# Patient Record
Sex: Female | Born: 1970 | Race: White | Hispanic: No | Marital: Single | Smoking: Former smoker
Health system: Southern US, Community
[De-identification: ages and names within clinical notes are randomized; demographics above are authoritative.]

## PROBLEM LIST (undated history)

## (undated) DIAGNOSIS — R4586 Emotional lability: Secondary | ICD-10-CM

## (undated) DIAGNOSIS — C801 Malignant (primary) neoplasm, unspecified: Secondary | ICD-10-CM

## (undated) HISTORY — DX: Emotional lability: R45.86

---

## 2002-11-19 DIAGNOSIS — C801 Malignant (primary) neoplasm, unspecified: Secondary | ICD-10-CM

## 2002-11-19 HISTORY — PX: CERVICAL CONE BIOPSY: SUR198

## 2002-11-19 HISTORY — DX: Malignant (primary) neoplasm, unspecified: C80.1

## 2006-12-02 ENCOUNTER — Ambulatory Visit (HOSPITAL_COMMUNITY): Admission: RE | Admit: 2006-12-02 | Discharge: 2006-12-02 | Payer: Self-pay | Admitting: Family Medicine

## 2009-06-09 ENCOUNTER — Ambulatory Visit (HOSPITAL_COMMUNITY): Admission: RE | Admit: 2009-06-09 | Discharge: 2009-06-09 | Payer: Self-pay | Admitting: Pediatrics

## 2009-07-27 ENCOUNTER — Ambulatory Visit (HOSPITAL_COMMUNITY): Admission: RE | Admit: 2009-07-27 | Discharge: 2009-07-27 | Payer: Self-pay | Admitting: Pediatrics

## 2009-08-01 ENCOUNTER — Ambulatory Visit (HOSPITAL_COMMUNITY): Admission: RE | Admit: 2009-08-01 | Discharge: 2009-08-01 | Payer: Self-pay | Admitting: Pediatrics

## 2010-05-25 ENCOUNTER — Ambulatory Visit (HOSPITAL_COMMUNITY): Admission: RE | Admit: 2010-05-25 | Discharge: 2010-05-25 | Payer: Self-pay | Admitting: Family Medicine

## 2011-08-20 ENCOUNTER — Other Ambulatory Visit (HOSPITAL_COMMUNITY): Payer: Self-pay | Admitting: Pediatrics

## 2011-08-20 DIAGNOSIS — R110 Nausea: Secondary | ICD-10-CM

## 2011-08-21 ENCOUNTER — Other Ambulatory Visit (HOSPITAL_COMMUNITY): Payer: Self-pay | Admitting: Pediatrics

## 2011-08-21 ENCOUNTER — Ambulatory Visit (HOSPITAL_COMMUNITY)
Admission: RE | Admit: 2011-08-21 | Discharge: 2011-08-21 | Disposition: A | Discharge status: Home / Self Care | Payer: Managed Care, Other (non HMO) | Source: Ambulatory Visit | Attending: Pediatrics | Admitting: Pediatrics

## 2011-08-21 DIAGNOSIS — R109 Unspecified abdominal pain: Secondary | ICD-10-CM | POA: Insufficient documentation

## 2011-08-21 DIAGNOSIS — R110 Nausea: Secondary | ICD-10-CM | POA: Insufficient documentation

## 2011-08-23 ENCOUNTER — Other Ambulatory Visit (HOSPITAL_COMMUNITY): Payer: Self-pay | Admitting: Internal Medicine

## 2011-08-23 DIAGNOSIS — R110 Nausea: Secondary | ICD-10-CM

## 2011-08-23 DIAGNOSIS — R1011 Right upper quadrant pain: Secondary | ICD-10-CM

## 2011-08-27 ENCOUNTER — Other Ambulatory Visit (HOSPITAL_COMMUNITY): Payer: Self-pay | Admitting: Pediatrics

## 2011-08-27 ENCOUNTER — Encounter (HOSPITAL_COMMUNITY)
Admission: RE | Admit: 2011-08-27 | Discharge: 2011-08-27 | Disposition: A | Discharge status: Home / Self Care | Payer: Managed Care, Other (non HMO) | Source: Ambulatory Visit | Attending: Internal Medicine | Admitting: Internal Medicine

## 2011-08-27 ENCOUNTER — Encounter (HOSPITAL_COMMUNITY): Payer: Self-pay

## 2011-08-27 DIAGNOSIS — R1011 Right upper quadrant pain: Secondary | ICD-10-CM

## 2011-08-27 DIAGNOSIS — R110 Nausea: Secondary | ICD-10-CM | POA: Insufficient documentation

## 2011-08-27 HISTORY — DX: Malignant (primary) neoplasm, unspecified: C80.1

## 2011-08-27 MED ORDER — SINCALIDE 5 MCG IJ SOLR
INTRAMUSCULAR | Status: AC
  Administered 2011-08-27: 1.32 ug
  Filled 2011-08-27: qty 5

## 2011-08-27 MED ORDER — TECHNETIUM TC 99M MEBROFENIN IV KIT
5.0000 | PACK | Freq: Once | INTRAVENOUS | Status: AC | PRN
  Administered 2011-08-27: 4.7 via INTRAVENOUS

## 2011-08-27 MED ORDER — SINCALIDE 5 MCG IJ SOLR: 0.0200 ug/kg | Freq: Once | INTRAMUSCULAR | Status: DC

## 2011-09-04 ENCOUNTER — Ambulatory Visit: Payer: Managed Care, Other (non HMO) | Admitting: Gastroenterology

## 2011-09-05 ENCOUNTER — Ambulatory Visit (INDEPENDENT_AMBULATORY_CARE_PROVIDER_SITE_OTHER): Payer: Managed Care, Other (non HMO) | Admitting: Gastroenterology

## 2011-09-05 ENCOUNTER — Encounter: Payer: Self-pay | Admitting: Gastroenterology

## 2011-09-05 DIAGNOSIS — R140 Abdominal distension (gaseous): Secondary | ICD-10-CM | POA: Insufficient documentation

## 2011-09-05 DIAGNOSIS — R143 Flatulence: Secondary | ICD-10-CM

## 2011-09-05 DIAGNOSIS — R1013 Epigastric pain: Secondary | ICD-10-CM | POA: Insufficient documentation

## 2011-09-05 DIAGNOSIS — R110 Nausea: Secondary | ICD-10-CM | POA: Insufficient documentation

## 2011-09-05 MED ORDER — DEXLANSOPRAZOLE 60 MG PO CPDR: 60.0000 mg | DELAYED_RELEASE_CAPSULE | Freq: Every day | ORAL | Status: AC

## 2011-09-05 NOTE — Patient Instructions (Addendum)
Please take Dexilant 60mg  daily for next four weeks. Take 1/2 hour before breakfast daily. Please call in 2 weeks with a progress report. If you continue to have nausea, upper abdominal discomfort, would recommend upper endoscopy for further evaluation. If you develop fever, severe abdominal pain, vomiting, excessive bloating we may need to consider a CT of the abdomen and pelvis. Please don't hesitate to call if you have any questions or concerns. Please limit Advil or other NSAIDs such as Aleve, aspirin as they may irritate your stomach. Consider Tylenol for pain relief.

## 2011-09-05 NOTE — Progress Notes (Signed)
Primary Care Physician:  Ara Kussmaul, MD  Primary Gastroenterologist:  Roetta Sessions, MD   Chief Complaint  Patient presents with  . Nausea    HPI:  Theresa Saunders is a 40 y.o. female here for further evaluation of recent illness. On 08/13/11, started having epigastric discomfort, nausea, bloating, overall felt bad. Initially seen at Urgent Care. Labs okay including H.Pylori, lipase, cmet, cbc, urine per PCP note. Went to PCP couple of days later for persistent symptoms.  Abd u/s normal. HIDA normal. No reproduction of symptoms.   Symptoms worse with food. Really hard to eat due to nausea. Decreased BMs secondary to poor oral intake. No significant weight loss.    Denies heartburn. Prior to 08/13/11, none of these symptoms. She has had alternating constipation/diarrhea for 7-8 years. Eats spicy foods to alleviate constipation when needed. Certain foods trigger diarrhea. No melena, brbpr.   Headache every day with these symptoms. Low grade fever of 99.   Over the past several days, finally able to eat better. Energy coming back . Has been out of work. Denies ill contacts or recent abx.    Current Outpatient Prescriptions  Medication Sig Dispense Refill  . citalopram (CELEXA) 40 MG tablet Take 40 mg by mouth daily.       Marland Kitchen ibuprofen (ADVIL,MOTRIN) 200 MG tablet Take 200 mg by mouth every 6 (six) hours as needed.        Marland Kitchen dexlansoprazole (DEXILANT) 60 MG capsule Take 1 capsule (60 mg total) by mouth daily.  30 capsule  0  . ondansetron (ZOFRAN) 8 MG tablet         Allergies as of 09/05/2011  . (No Known Allergies)    Past Medical History  Diagnosis Date  . Cancer 2004    cervical   . 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 Cervical cone biopsy 2004    Family History  Problem Relation Age of Onset  . Colon cancer Neg Hx   . Liver disease Neg Hx   . Heart disease Father   . Colon polyps Father     less than age 2    History    Social History  . Marital Status: Single    Spouse Name: N/A    Number of Children: 3  . Years of Education: N/A   Occupational History  .  [employer]    Curator   Social History Main Topics  . Smoking status: Former Smoker -- 0.5 packs/day    Types: Cigarettes  . Smokeless tobacco: Not on file   Comment: quit about 5 months  . Alcohol Use: No  . Drug Use: No  . Sexually Active: Yes -- Female partner(s)    Birth Control/ Protection: Surgical     husband-vasectomy   Other Topics Concern  . Not on file   Social History Narrative  . No narrative on file      ROS:  General:  See HPI. Eyes: Negative for vision changes.  ENT: Negative for hoarseness, difficulty swallowing , nasal congestion. CV: Negative for chest pain, angina, palpitations, dyspnea on exertion, peripheral edema.  Respiratory: Negative for dyspnea at rest, dyspnea on exertion, cough, sputum, wheezing.  GI: See history of present illness. GU:  Negative for dysuria, hematuria, urinary incontinence, urinary frequency, nocturnal urination.  MS: Negative for joint pain, low back pain.  Derm: Negative for rash or itching.  Neuro: Negative for weakness, abnormal sensation, seizure,  frequent headaches, memory loss, confusion. Recent H/A. Psych: Negative for anxiety, depression, suicidal ideation, hallucinations.  Endo: Negative for unusual weight change.  Heme: Negative for bruising or bleeding. Allergy: Negative for rash or hives.    Physical Examination:  BP 114/77  Pulse 82  Temp(Src) 97.6 F (36.4 C) (Temporal)  Ht 5\' 6"  (1.676 m)  Wt 150 lb 6.4 oz (68.221 kg)  BMI 24.28 kg/m2  LMP 08/23/2011   General: Well-nourished, well-developed in no acute distress.  Head: Normocephalic, atraumatic.   Eyes: Conjunctiva pink, no icterus. Mouth: Oropharyngeal mucosa moist and pink , no lesions erythema or exudate. Neck: Supple without thyromegaly, masses, or lymphadenopathy.  Lungs: Clear to auscultation  bilaterally.  Heart: Regular rate and rhythm, no murmurs rubs or gallops.  Abdomen: Bowel sounds are normal, minimal epigastric tenderness, nondistended, no hepatosplenomegaly or masses, no abdominal bruits or    hernia , no rebound or guarding.   Rectal: Not performed. Extremities: No lower extremity edema. No clubbing or deformities.  Neuro: Alert and oriented x 4 , grossly normal neurologically.  Skin: Warm and dry, no rash or jaundice.   Psych: Alert and cooperative, normal mood and affect.  Labs: As above.  Imaging Studies: Nm Hepato W/eject Fract  08/27/2011  *RADIOLOGY REPORT*  Clinical Data:  Right upper quadrant pain, severe nausea  NUCLEAR MEDICINE HEPATOBILIARY IMAGING WITH GALLBLADDER EF:  Technique: Sequential images of the abdomen were obtained for 60 minutes following intravenous administration of radiopharmaceutical.  Patient then received an infusion of 0.02 ugm/kg of CCK analog intravenously over 30 minutes, and imaging was continued for 30 minutes.  A time-activity curve was generated from tracer within the gallbladder following CCK administration, and the gallbladder ejection fraction was calculated.  Radiopharmaceutical:  4.7 mCi Tc-69m mebrofenin Pharmaceutical:  1.32 mcg CCK  Comparison: None  Findings:  Prompt tracer extraction from bloodstream, indicating normal hepatocellular function. Prompt excretion of tracer into biliary tree. Gallbladder visualized at 7 minutes. Small bowel visualized at 21 minutes. No hepatic retention of tracer.  Subjectively normal emptying of tracer from gallbladder following CCK stimulation. Calculated gallbladder ejection fraction is 88%, normal. Patient experienced nausea and abdominal pressure following CCK administration.  IMPRESSION: Patent biliary tree. Normal gallbladder ejection fraction of 88% following CCK stimulation. Nausea and abdominal pressure following CCK stimulation.  Normal values for gallbladder ejection fraction: > 30% for exams  utilizing sincalide (CCK) > 50% for exams utilizing fatty meal stimulation  Original Report Authenticated By: Lollie Marrow, M.D.   US Abdomen Limited Ruq  08/21/2011  *RADIOLOGY REPORT*  Clinical Data:  Abdominal pain, nausea  LIMITED ABDOMINAL ULTRASOUND - RIGHT UPPER QUADRANT  Comparison:  None  Findings:  Gallbladder:  Normally distended without stones or wall thickening. No pericholecystic fluid or sonographic Murphy sign.  Common bile duct:  Normal caliber 4 mm diameter.  Liver:  Normal appearance.  IMPRESSION: Normal limited right upper quadrant ultrasound examination.                   Original Report Authenticated By: Lollie Marrow, M.D.

## 2011-09-05 NOTE — Assessment & Plan Note (Signed)
2-3 week h/o epigastric discomfort associated with bloating, nausea. Patient describes debilitating symptoms. W/U unrevealing so far. Significantly improved over the last several days. At baseline, suspect element of IBS.   Current symptoms, ?viral, doubt biliary or partial obstruction. DDX also includes gastritis, PUD. As patient is feeling better, we will treat with PPI for next four weeks. Avoid NSAIDS/ASA. She will call with PR in two weeks OR if symptoms worsen she will call sooner. At that point, based on symptoms she may need CT A/P vs EGD.

## 2011-09-05 NOTE — Progress Notes (Signed)
Cc to PCP 

## 2014-03-05 ENCOUNTER — Other Ambulatory Visit: Payer: Self-pay | Admitting: *Deleted

## 2014-03-05 DIAGNOSIS — N63 Unspecified lump in unspecified breast: Secondary | ICD-10-CM

## 2014-03-05 DIAGNOSIS — N644 Mastodynia: Secondary | ICD-10-CM

## 2014-03-10 ENCOUNTER — Encounter (INDEPENDENT_AMBULATORY_CARE_PROVIDER_SITE_OTHER): Payer: Self-pay

## 2014-03-10 ENCOUNTER — Ambulatory Visit
Admission: RE | Admit: 2014-03-10 | Discharge: 2014-03-10 | Disposition: A | Discharge status: Home / Self Care | Payer: BC Managed Care – PPO | Source: Ambulatory Visit | Attending: *Deleted | Admitting: *Deleted

## 2014-03-10 DIAGNOSIS — N644 Mastodynia: Secondary | ICD-10-CM

## 2014-03-10 DIAGNOSIS — N63 Unspecified lump in unspecified breast: Secondary | ICD-10-CM

## 2016-11-05 ENCOUNTER — Other Ambulatory Visit: Payer: Self-pay | Admitting: *Deleted

## 2016-11-05 DIAGNOSIS — N63 Unspecified lump in unspecified breast: Secondary | ICD-10-CM

## 2016-11-09 ENCOUNTER — Ambulatory Visit
Admission: RE | Admit: 2016-11-09 | Discharge: 2016-11-09 | Disposition: A | Discharge status: Home / Self Care | Payer: BLUE CROSS/BLUE SHIELD | Source: Ambulatory Visit | Attending: *Deleted | Admitting: *Deleted

## 2016-11-09 DIAGNOSIS — N63 Unspecified lump in unspecified breast: Secondary | ICD-10-CM

## 2018-07-19 ENCOUNTER — Ambulatory Visit (HOSPITAL_COMMUNITY)
Admission: RE | Admit: 2018-07-19 | Discharge: 2018-07-19 | Disposition: A | Discharge status: Home / Self Care | Payer: BLUE CROSS/BLUE SHIELD | Source: Ambulatory Visit | Attending: Physician Assistant | Admitting: Physician Assistant

## 2018-07-19 ENCOUNTER — Other Ambulatory Visit (HOSPITAL_COMMUNITY): Payer: Self-pay | Admitting: Physician Assistant

## 2018-07-19 DIAGNOSIS — R52 Pain, unspecified: Secondary | ICD-10-CM

## 2018-07-19 DIAGNOSIS — I700 Atherosclerosis of aorta: Secondary | ICD-10-CM | POA: Diagnosis not present

## 2018-07-19 DIAGNOSIS — K760 Fatty (change of) liver, not elsewhere classified: Secondary | ICD-10-CM | POA: Diagnosis not present

## 2018-07-19 MED ORDER — IOPAMIDOL (ISOVUE-300) INJECTION 61%
100.0000 mL | Freq: Once | INTRAVENOUS | Status: AC | PRN
  Administered 2018-07-19: 100 mL via INTRAVENOUS

## 2018-07-29 ENCOUNTER — Encounter: Payer: Self-pay | Admitting: Gastroenterology

## 2018-09-05 ENCOUNTER — Ambulatory Visit: Payer: BLUE CROSS/BLUE SHIELD | Admitting: Gastroenterology

## 2019-08-17 ENCOUNTER — Encounter: Payer: Self-pay | Admitting: Physician Assistant

## 2020-03-14 ENCOUNTER — Other Ambulatory Visit: Payer: Self-pay | Admitting: Gastroenterology

## 2020-03-14 DIAGNOSIS — R1011 Right upper quadrant pain: Secondary | ICD-10-CM

## 2020-03-14 DIAGNOSIS — R1013 Epigastric pain: Secondary | ICD-10-CM

## 2020-03-18 ENCOUNTER — Ambulatory Visit
Admission: RE | Admit: 2020-03-18 | Discharge: 2020-03-18 | Disposition: A | Discharge status: Home / Self Care | Payer: BLUE CROSS/BLUE SHIELD | Source: Ambulatory Visit | Attending: Gastroenterology | Admitting: Gastroenterology

## 2020-03-18 DIAGNOSIS — R1013 Epigastric pain: Secondary | ICD-10-CM

## 2020-03-18 DIAGNOSIS — R1011 Right upper quadrant pain: Secondary | ICD-10-CM

## 2020-05-06 ENCOUNTER — Other Ambulatory Visit: Payer: Self-pay | Admitting: Gastroenterology

## 2020-05-06 ENCOUNTER — Other Ambulatory Visit (HOSPITAL_COMMUNITY): Payer: Self-pay | Admitting: Gastroenterology

## 2020-05-06 DIAGNOSIS — R1011 Right upper quadrant pain: Secondary | ICD-10-CM

## 2020-05-06 DIAGNOSIS — R1013 Epigastric pain: Secondary | ICD-10-CM

## 2020-05-17 ENCOUNTER — Encounter (HOSPITAL_COMMUNITY)
Admission: RE | Admit: 2020-05-17 | Discharge: 2020-05-17 | Disposition: A | Discharge status: Home / Self Care | Payer: BC Managed Care – PPO | Source: Ambulatory Visit | Attending: Gastroenterology | Admitting: Gastroenterology

## 2020-05-17 DIAGNOSIS — R1013 Epigastric pain: Secondary | ICD-10-CM | POA: Diagnosis present

## 2020-05-17 DIAGNOSIS — R1011 Right upper quadrant pain: Secondary | ICD-10-CM | POA: Diagnosis present

## 2020-05-17 MED ORDER — TECHNETIUM TC 99M MEBROFENIN IV KIT
5.0000 | PACK | Freq: Once | INTRAVENOUS | Status: AC | PRN
  Administered 2020-05-17: 5 via INTRAVENOUS

## 2020-06-20 ENCOUNTER — Other Ambulatory Visit: Payer: Self-pay | Admitting: Gastroenterology

## 2020-06-20 DIAGNOSIS — R109 Unspecified abdominal pain: Secondary | ICD-10-CM

## 2020-07-01 ENCOUNTER — Other Ambulatory Visit: Payer: BC Managed Care – PPO

## 2020-07-04 ENCOUNTER — Other Ambulatory Visit: Payer: Self-pay | Admitting: *Deleted

## 2020-07-04 DIAGNOSIS — Z1231 Encounter for screening mammogram for malignant neoplasm of breast: Secondary | ICD-10-CM

## 2020-07-07 ENCOUNTER — Other Ambulatory Visit: Payer: Self-pay | Admitting: Physician Assistant

## 2020-07-07 DIAGNOSIS — N6321 Unspecified lump in the left breast, upper outer quadrant: Secondary | ICD-10-CM

## 2020-07-22 ENCOUNTER — Ambulatory Visit
Admission: RE | Admit: 2020-07-22 | Discharge: 2020-07-22 | Disposition: A | Discharge status: Home / Self Care | Payer: BC Managed Care – PPO | Source: Ambulatory Visit | Attending: Physician Assistant | Admitting: Physician Assistant

## 2020-07-22 ENCOUNTER — Other Ambulatory Visit: Payer: Self-pay

## 2020-07-22 DIAGNOSIS — N6321 Unspecified lump in the left breast, upper outer quadrant: Secondary | ICD-10-CM

## 2021-06-15 ENCOUNTER — Other Ambulatory Visit: Payer: Self-pay | Admitting: Gastroenterology

## 2021-06-15 DIAGNOSIS — R109 Unspecified abdominal pain: Secondary | ICD-10-CM

## 2021-07-05 IMAGING — US US BREAST*L* LIMITED INC AXILLA
1 series · 13 of 19 positions shown · non-contrast
Comparison: Previous exam(s).

CLINICAL DATA: 49-year-old female presenting for evaluation of a
palpable lump in the left breast for about 2-3 weeks.

EXAM:
DIGITAL DIAGNOSTIC BILATERAL MAMMOGRAM WITH TOMO AND CAD; ULTRASOUND
LEFT BREAST LIMITED

[Series 1: us breast*left* limited inc axilla · 0.06mm/px · 13 of 19 slices shown]
[im 1/19]
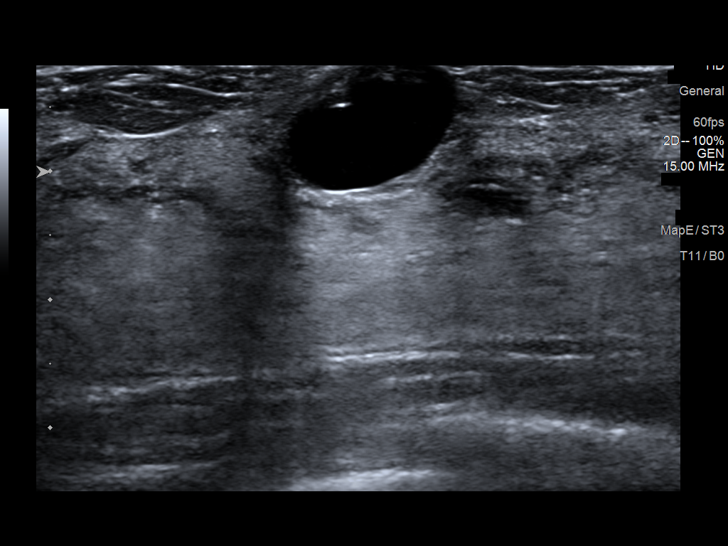
[im 3/19]
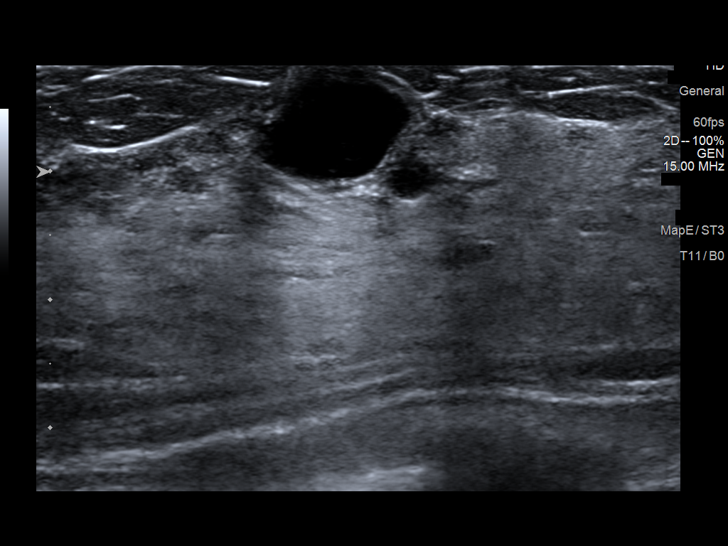
[im 4/19]
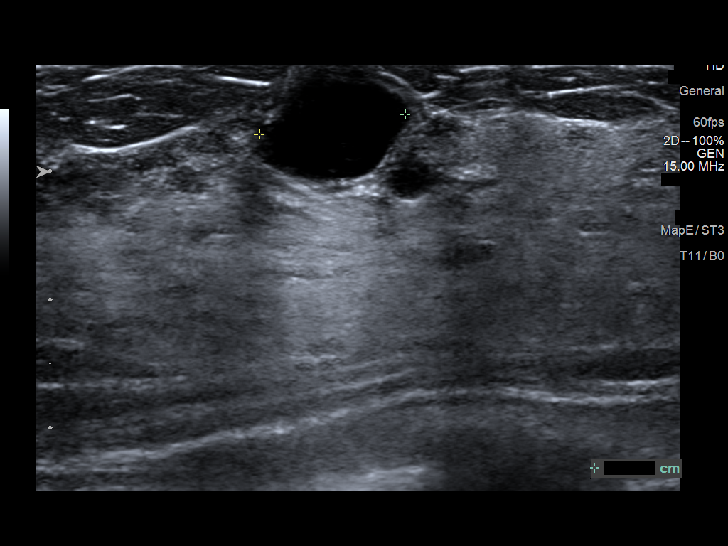
[im 6/19]
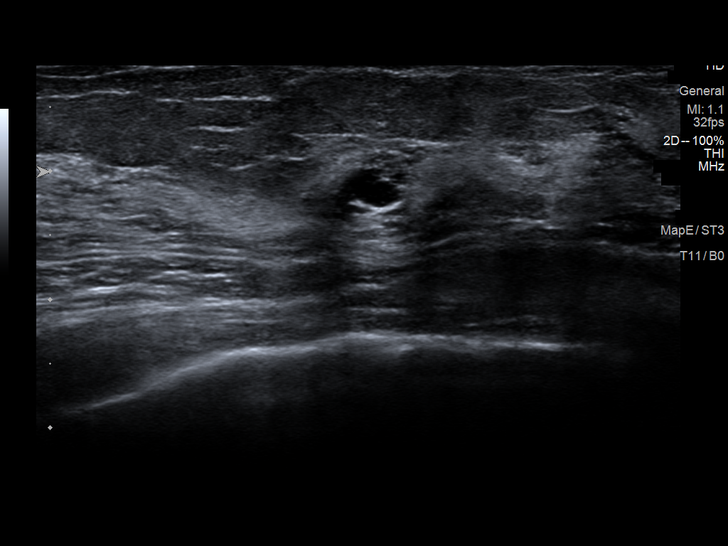
[im 7/19]
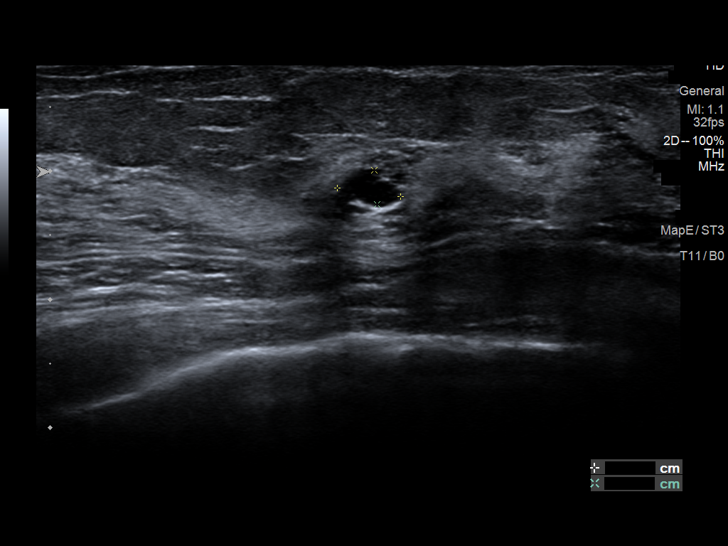
[im 9/19]
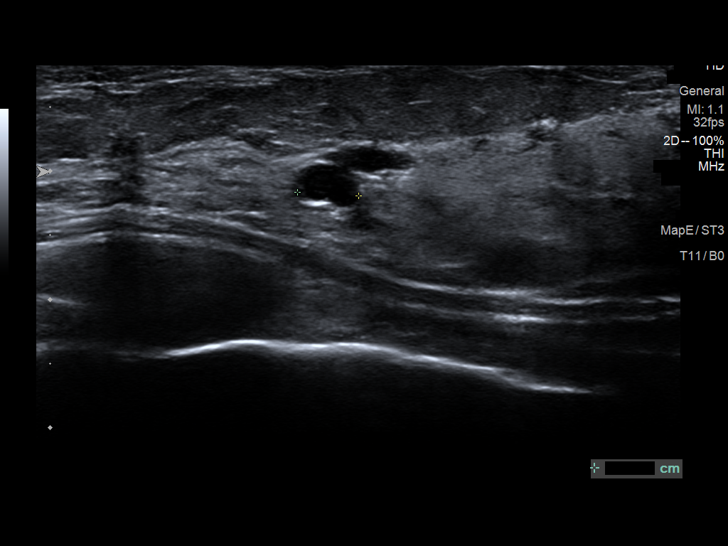
[im 10/19]
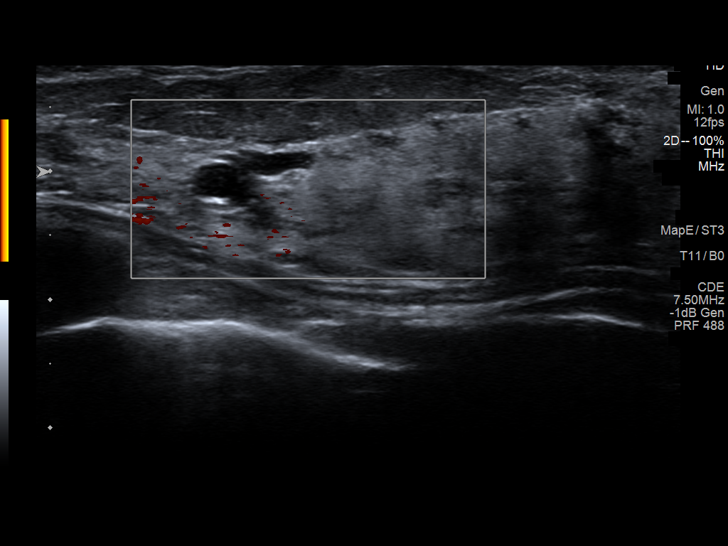
[im 11/19]
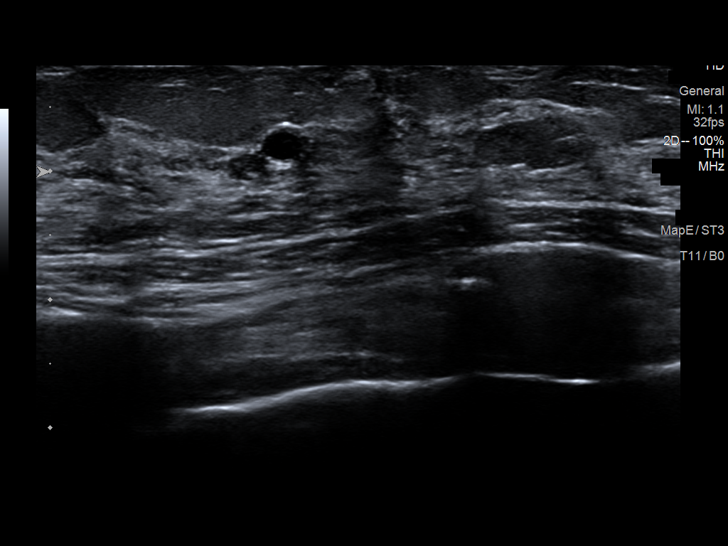
[im 13/19]
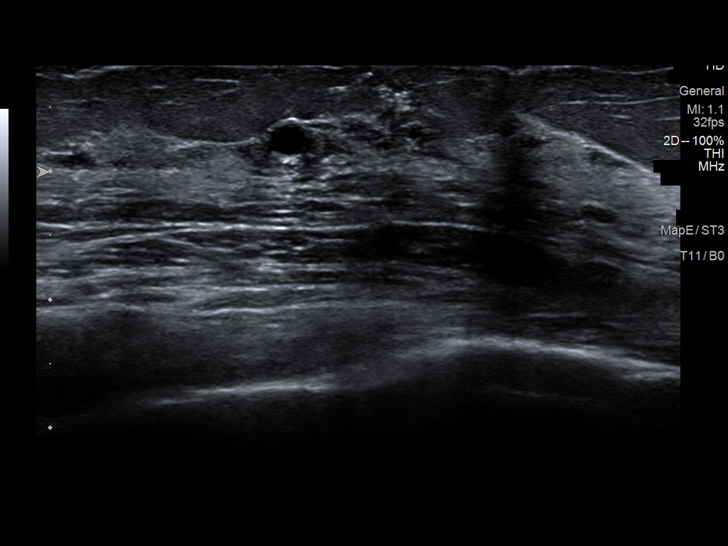
[im 14/19]
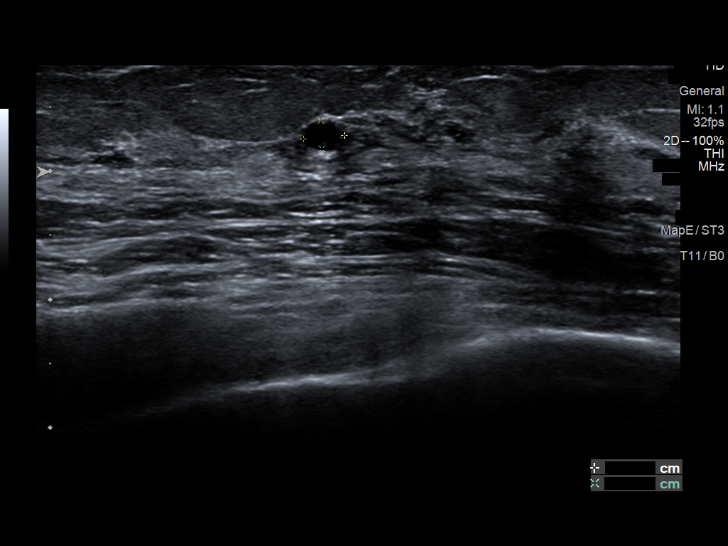
[im 16/19]
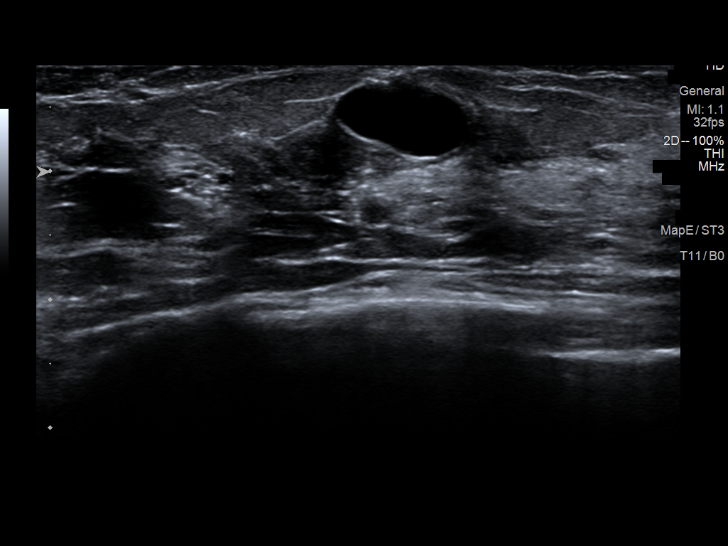
[im 17/19]
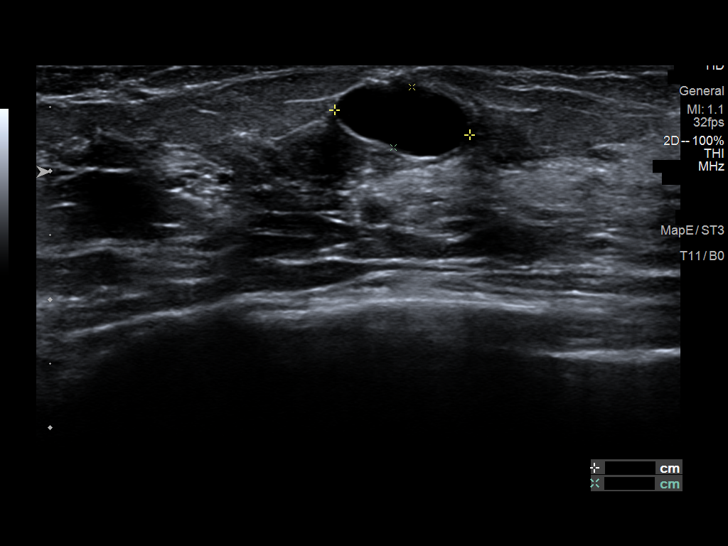
[im 19/19]
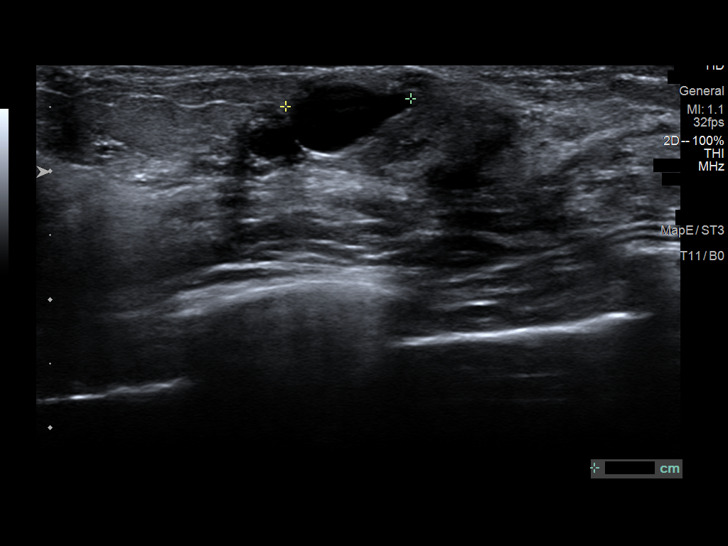

[13 of 19 positions shown; findings below may reference images not displayed]

ACR Breast Density Category d: The breast tissue is extremely dense,
which lowers the sensitivity of mammography.
FINDINGS: A BB has been placed at the palpable site of concern on the superior
left breast. There is a possible obscured superficial just deep to
the marker. An asymmetry is seen in the inferior left breast which
appears to resolve on spot compression tomosynthesis imaging.
However, due to the density of the patient's breast tissue,
ultrasound will be performed for further evaluation.

Mammographic images were processed with CAD.

Ultrasound targeted to the palpable site in the left breast at
[DATE], 2 cm from the nipple is demonstrates an anechoic oval
circumscribed superficial mass measuring 1.4 x 0.8 x 1.2 cm.
Ultrasound of the left breast at 5 o'clock, 4 cm from the nipple
demonstrates a similar-appearing mass measuring 1.1 x 0.5 x 1.0 cm.
This likely corresponds with the possible obscured mass identified
mammographically. Multiple other anechoic benign cysts are seen
scattered throughout the inferior left breast.
IMPRESSION: 1. The palpable lump in the superior left breast corresponds with a
benign cyst. Multiple other scattered benign cysts are seen in the
left breast as well.

2.  No evidence of malignancy in the bilateral breasts.

RECOMMENDATION:
Screening mammogram in one year.(Code:[id number]-IY3)

I have discussed the findings and recommendations with the patient.
If applicable, a reminder letter will be sent to the patient
regarding the next appointment.

BI-RADS CATEGORY  2: Benign.

## 2021-08-01 DIAGNOSIS — L810 Postinflammatory hyperpigmentation: Secondary | ICD-10-CM | POA: Diagnosis not present

## 2021-08-01 DIAGNOSIS — D225 Melanocytic nevi of trunk: Secondary | ICD-10-CM | POA: Diagnosis not present

## 2022-05-14 DIAGNOSIS — R030 Elevated blood-pressure reading, without diagnosis of hypertension: Secondary | ICD-10-CM | POA: Diagnosis not present

## 2022-05-14 DIAGNOSIS — Z8742 Personal history of other diseases of the female genital tract: Secondary | ICD-10-CM | POA: Diagnosis not present

## 2022-05-14 DIAGNOSIS — Z01419 Encounter for gynecological examination (general) (routine) without abnormal findings: Secondary | ICD-10-CM | POA: Diagnosis not present

## 2022-05-14 DIAGNOSIS — Z3041 Encounter for surveillance of contraceptive pills: Secondary | ICD-10-CM | POA: Diagnosis not present

## 2022-05-28 DIAGNOSIS — Z13220 Encounter for screening for lipoid disorders: Secondary | ICD-10-CM | POA: Diagnosis not present

## 2022-05-28 DIAGNOSIS — Z1321 Encounter for screening for nutritional disorder: Secondary | ICD-10-CM | POA: Diagnosis not present

## 2022-05-28 DIAGNOSIS — Z131 Encounter for screening for diabetes mellitus: Secondary | ICD-10-CM | POA: Diagnosis not present

## 2022-05-28 DIAGNOSIS — Z130 Encounter for screening for diseases of the blood and blood-forming organs and certain disorders involving the immune mechanism: Secondary | ICD-10-CM | POA: Diagnosis not present

## 2022-05-28 DIAGNOSIS — R030 Elevated blood-pressure reading, without diagnosis of hypertension: Secondary | ICD-10-CM | POA: Diagnosis not present

## 2022-05-28 DIAGNOSIS — R7989 Other specified abnormal findings of blood chemistry: Secondary | ICD-10-CM | POA: Diagnosis not present

## 2022-08-15 DIAGNOSIS — R7989 Other specified abnormal findings of blood chemistry: Secondary | ICD-10-CM | POA: Diagnosis not present

## 2022-08-15 DIAGNOSIS — E221 Hyperprolactinemia: Secondary | ICD-10-CM | POA: Diagnosis not present

## 2022-08-15 DIAGNOSIS — N926 Irregular menstruation, unspecified: Secondary | ICD-10-CM | POA: Diagnosis not present

## 2022-08-15 DIAGNOSIS — Z6824 Body mass index (BMI) 24.0-24.9, adult: Secondary | ICD-10-CM | POA: Diagnosis not present

## 2022-08-15 DIAGNOSIS — R519 Headache, unspecified: Secondary | ICD-10-CM | POA: Diagnosis not present

## 2022-08-29 DIAGNOSIS — G44209 Tension-type headache, unspecified, not intractable: Secondary | ICD-10-CM | POA: Diagnosis not present

## 2022-08-29 DIAGNOSIS — E221 Hyperprolactinemia: Secondary | ICD-10-CM | POA: Diagnosis not present

## 2022-08-29 DIAGNOSIS — E236 Other disorders of pituitary gland: Secondary | ICD-10-CM | POA: Diagnosis not present

## 2022-08-29 DIAGNOSIS — R519 Headache, unspecified: Secondary | ICD-10-CM | POA: Diagnosis not present

## 2022-08-29 DIAGNOSIS — R9089 Other abnormal findings on diagnostic imaging of central nervous system: Secondary | ICD-10-CM | POA: Diagnosis not present

## 2022-09-04 DIAGNOSIS — D352 Benign neoplasm of pituitary gland: Secondary | ICD-10-CM | POA: Diagnosis not present

## 2022-09-04 DIAGNOSIS — E221 Hyperprolactinemia: Secondary | ICD-10-CM | POA: Diagnosis not present

## 2022-09-11 DIAGNOSIS — D352 Benign neoplasm of pituitary gland: Secondary | ICD-10-CM | POA: Diagnosis not present

## 2022-09-11 DIAGNOSIS — E221 Hyperprolactinemia: Secondary | ICD-10-CM | POA: Diagnosis not present

## 2023-02-14 DIAGNOSIS — E221 Hyperprolactinemia: Secondary | ICD-10-CM | POA: Diagnosis not present

## 2023-02-14 DIAGNOSIS — D352 Benign neoplasm of pituitary gland: Secondary | ICD-10-CM | POA: Diagnosis not present

## 2023-05-14 ENCOUNTER — Other Ambulatory Visit: Payer: Self-pay

## 2023-05-14 DIAGNOSIS — R7989 Other specified abnormal findings of blood chemistry: Secondary | ICD-10-CM

## 2023-05-24 ENCOUNTER — Other Ambulatory Visit: Payer: Self-pay

## 2023-05-24 DIAGNOSIS — R7989 Other specified abnormal findings of blood chemistry: Secondary | ICD-10-CM

## 2023-06-05 ENCOUNTER — Other Ambulatory Visit (INDEPENDENT_AMBULATORY_CARE_PROVIDER_SITE_OTHER): Payer: BC Managed Care – PPO

## 2023-06-05 DIAGNOSIS — E229 Hyperfunction of pituitary gland, unspecified: Secondary | ICD-10-CM | POA: Diagnosis not present

## 2023-06-05 DIAGNOSIS — R7989 Other specified abnormal findings of blood chemistry: Secondary | ICD-10-CM

## 2023-06-05 DIAGNOSIS — D352 Benign neoplasm of pituitary gland: Secondary | ICD-10-CM | POA: Diagnosis not present

## 2023-06-05 LAB — T4, FREE: Free T4: 0.82 ng/dL (ref 0.60–1.60)

## 2023-06-05 LAB — TSH: TSH: 4.41 u[IU]/mL (ref 0.35–5.50)

## 2023-06-05 LAB — BASIC METABOLIC PANEL
BUN: 18 mg/dL (ref 6–23)
CO2: 26 mEq/L (ref 19–32)
Calcium: 9.7 mg/dL (ref 8.4–10.5)
Chloride: 105 mEq/L (ref 96–112)
Creatinine, Ser: 0.84 mg/dL (ref 0.40–1.20)
GFR: 80.44 mL/min (ref >60.00)
Glucose, Bld: 103 mg/dL — ABNORMAL HIGH (ref 70–99)
Potassium: 4.9 mEq/L (ref 3.5–5.1)
Sodium: 139 mEq/L (ref 135–145)

## 2023-06-05 LAB — CORTISOL: Cortisol, Plasma: 10 ug/dL

## 2023-06-05 LAB — FOLLICLE STIMULATING HORMONE: FSH: 60.2 m[IU]/mL

## 2023-06-05 LAB — LUTEINIZING HORMONE: LH: 26.94 m[IU]/mL

## 2023-06-10 ENCOUNTER — Encounter: Payer: Self-pay | Admitting: "Endocrinology

## 2023-06-10 ENCOUNTER — Ambulatory Visit: Payer: BC Managed Care – PPO | Admitting: "Endocrinology

## 2023-06-10 VITALS — BP 120/85 | HR 69 | Ht 66.0 in | Wt 166.8 lb

## 2023-06-10 DIAGNOSIS — E229 Hyperfunction of pituitary gland, unspecified: Secondary | ICD-10-CM

## 2023-06-10 DIAGNOSIS — D352 Benign neoplasm of pituitary gland: Secondary | ICD-10-CM

## 2023-06-10 LAB — ESTRADIOL: Estradiol: 19 pg/mL

## 2023-06-10 NOTE — Progress Notes (Signed)
Outpatient Endocrinology Note Altamese Elberfeld, MD    Theresa Saunders 01/11/1971 [id number]  Referring Provider: Orbie Hurst, NP Primary Care Provider: Ara Kussmaul, MD (Inactive) Reason for consultation: Subjective   Assessment & Plan  Theresa Saunders was seen today for elevated prolactin levels.  Diagnoses and all orders for this visit:  Pituitary microadenoma with hyperprolactinemia (HCC)    Patient was diagnosed up to 3 mm pituitary adenoma versus Rathke's cyst 3 on 08/2022 MRI brain done after her prolactin was found mildly elevated at 30.8 in 05/2022 (was on birth control then, cut off 25). Three repeat prolactin levels have been WNL since (21.1, 24.5 and 18.2). Patient has had no galactorrhea. In peri-menopausal state with irregular menstrual cycle now. IGF was found mildly elevated at 232 on 10/24/2-23 (cut off <225), repeat was normal at 207. Another repeat is pending now.  No head aches/vision issues/nausea/vomiting at this time.   Will follow up on IGF-1 levels and repeat MRI brain 1 year from last to follow up.   Return in about 3 months (around 09/11/2023).   I have reviewed current medications, nurse's notes, allergies, vital signs, past medical and surgical history, family medical history, and social history for this encounter. Counseled patient on symptoms, examination findings, lab findings, imaging results, treatment decisions and monitoring and prognosis. The patient understood the recommendations and agrees with the treatment plan. All questions regarding treatment plan were fully answered.  Altamese Culpeper, MD  06/10/23   History of Present Illness HPI  Theresa Saunders is a 52 y.o. female referred by Dr. Arlyn Leak for evaluation and management of pituitary adenoma.   This was discovered by MRI in 08/2022. Most recent MRI demonstrated 3 mm pituitary adenoma vs Rathke's cleft cyst. Reviewed past records, seen by endocrinology at [facility].   She reports  the following;  Headaches No visual blurring/ diplopia/ fields defect No galactorrhea No If female: Menarche was at age 81 and LMP 05/30/23, was on OCP till 10/2022, and has had irregular periods since   change in facial appearance Yes body habitus No change in her hand, ring Yes hat, shoe size No hyperhidrosis Yes arthralgias Yes  Family history is negative for pituitary tumor or other abnormalities concerning for MEN syndrome.    EXAM: Magnetic resonance imaging, brain without and with contrast material.  DATE: 08/29/2022 1:58 PM  INTERPRETATION LOCATION: [facility]   CLINICAL INDICATION: 52 years old Female with Hyperprolactinemia, headaches ; Headache, tension - type - E22.1 - Hyperprolactinemia (CMS - HCC)   COMPARISON: None   TECHNIQUE: Multiplanar, multisequence MR imaging of the brain was performed without and with I.V. contrast. Imaging of the pituitary gland including dynamic and thin section coronal and sagittal images was also performed.   FINDINGS:  Small left inferior pituitary nodule (13:4) measuring up to 3 mm which demonstrates hypoenhancement on the early dynamic contrast-enhanced studies, with subsequent wash-in of contrast. 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 The pituitary stalk is midline. The optic chiasm is normal.   Ventricles are normal in size. There is no midline shift. No extra-axial fluid collection. No evidence of intracranial hemorrhage. No diffusion weighted signal abnormality to suggest acute infarct. No cerebral or cerebellar mass. Moderate mucosal thickening of the bilateral maxillary sinuses.   Component     Latest Ref Rng 06/05/2023  Sodium  135 - 145 mEq/L 139   Potassium     3.5 - 5.1 mEq/L 4.9   Chloride     96 - 112 mEq/L 105   CO2     19 - 32 mEq/L 26   Glucose     70 - 99 mg/dL 782 (H)   BUN     6 -  23 mg/dL 18   Creatinine     9.56 - 1.20 mg/dL 2.13   GFR     >08.65 mL/min 80.44   Calcium     8.4 - 10.5 mg/dL 9.7   Cortisol, Plasma     ug/dL 78.4   O962 ACTH     6 - 50 pg/mL 11   Prolactin     ng/mL 18.2   LH     mIU/mL 26.94   FSH     mIU/ML 60.2   Estradiol     pg/mL 19   T4,Free(Direct)     0.60 - 1.60 ng/dL 9.52   TSH     8.41 - 3.24 uIU/mL 4.41   Growth Hormone     < OR = 7.1 ng/mL 0.3              Physical Exam  BP 120/85   Pulse 69   Ht 5\' 6"  (1.676 m)   Wt 166 lb 12.8 oz (75.7 kg)   SpO2 97%   BMI 26.92 kg/m    Constitutional: well developed, well nourished Head: normocephalic, atraumatic Eyes: sclera anicteric, no redness Neck: supple Lungs: normal respiratory effort Neurology: alert and oriented Skin: dry, no appreciable rashes Musculoskeletal: no appreciable defects Psychiatric: normal mood and affect   Current Medications Patient's Medications  New Prescriptions   No medications on file  Previous Medications   No medications on file  Modified Medications   No medications on file  Discontinued Medications   CITALOPRAM (CELEXA) 40 MG TABLET    Take 40 mg by mouth daily.    IBUPROFEN (ADVIL,MOTRIN) 200 MG TABLET    Take 200 mg by mouth every 6 (six) hours as needed.     ONDANSETRON (ZOFRAN) 8 MG TABLET        Allergies No Known Allergies  Past Medical History Past Medical History:  Diagnosis Date   Cancer (HCC) 2004   cervical    Mood altered    on celexa    Past Surgical History Past Surgical History:  Procedure Laterality Date   CERVICAL CONE BIOPSY  2004    Family History family history includes Colon polyps in her father; Heart disease in her father.  Social History Social History   Socioeconomic History   Marital status: Single    Spouse name: Not on file   Number of children: 3   Years of education: Not on file   Highest education level: Not on file  Occupational History    Employer: [employer]     Comment: Curator  Tobacco Use   Smoking status: Former    Current packs/day: 0.50    Types: Cigarettes   Smokeless tobacco: Not on file   Tobacco comments:    quit about 5 months  Substance and Sexual Activity   Alcohol use: No   Drug use: No   Sexual activity: Yes    Partners: Female    Birth control/protection: Surgical    Comment: husband-vasectomy  Other Topics Concern   Not on file  Social History Narrative   Not on file   Social Determinants of Health  Financial Resource Strain: Not on file  Food Insecurity: Not on file  Transportation Needs: Not on file  Physical Activity: Not on file  Stress: Not on file  Social Connections: Not on file  Intimate Partner Violence: Not At Risk (05/14/2022)   Received from [facility], [facility]   Humiliation, Afraid, Rape, and Kick questionnaire    Fear of Current or Ex-Partner: No    Emotionally Abused: No    Physically Abused: No    Sexually Abused: No    No results found for: "CHOL" No results found for: "HDL" No results found for: "LDLCALC" No results found for: "TRIG" No results found for: "CHOLHDL" Lab Results  Component Value Date   CREATININE 0.84 06/05/2023   Lab Results  Component Value Date   GFR 80.44 06/05/2023      Component Value Date/Time   NA 139 06/05/2023 0847   K 4.9 06/05/2023 0847   CL 105 06/05/2023 0847   CO2 26 06/05/2023 0847   GLUCOSE 103 (H) 06/05/2023 0847   BUN 18 06/05/2023 0847   CREATININE 0.84 06/05/2023 0847   CALCIUM 9.7 06/05/2023 0847      Latest Ref Rng & Units 06/05/2023    8:47 AM  BMP  Glucose 70 - 99 mg/dL 254   BUN 6 - 23 mg/dL 18   Creatinine 2.70 - 1.20 mg/dL 6.23   Sodium 762 - 831 mEq/L 139   Potassium 3.5 - 5.1 mEq/L 4.9   Chloride 96 - 112 mEq/L 105   CO2 19 - 32 mEq/L 26   Calcium 8.4 - 10.5 mg/dL 9.7     No results found for: "WBC", "RBC", "HGB", "HCT", "PLT", "MCV", "MCH", "MCHC", "RDW", "LYMPHSABS", "MONOABS", "EOSABS", "BASOSABS" Lab  Results  Component Value Date   TSH 4.41 06/05/2023   FREET4 0.82 06/05/2023         Parts of this note may have been dictated using voice recognition software. There may be variances in spelling and vocabulary which are unintentional. Not all errors are proofread. Please notify the Thereasa Parkin if any discrepancies are noted or if the meaning of any statement is not clear.

## 2023-06-11 LAB — INSULIN-LIKE GROWTH FACTOR
IGF-I, LC/MS: 150 ng/mL (ref 50–317)
Z-Score (Female): 0.1 {STDV}

## 2023-06-11 LAB — ACTH: C206 ACTH: 11 pg/mL (ref 6–50)

## 2023-06-11 LAB — PROLACTIN: Prolactin: 18.2 ng/mL

## 2023-06-11 LAB — GROWTH HORMONE: Growth Hormone: 0.3 ng/mL

## 2023-06-17 ENCOUNTER — Telehealth: Payer: Self-pay

## 2023-06-17 NOTE — Telephone Encounter (Signed)
Theresa Saunders Is calling asking for the IGF Results from 06/05/23 , I took a look for her I told her I would ask you to take a look behind me and that I will call her with results

## 2023-09-10 ENCOUNTER — Encounter: Payer: Self-pay | Admitting: "Endocrinology

## 2023-09-10 ENCOUNTER — Ambulatory Visit: Payer: BC Managed Care – PPO | Admitting: "Endocrinology

## 2023-09-10 VITALS — BP 122/82 | HR 91 | Ht 66.0 in | Wt 167.8 lb

## 2023-09-10 DIAGNOSIS — E229 Hyperfunction of pituitary gland, unspecified: Secondary | ICD-10-CM | POA: Diagnosis not present

## 2023-09-10 DIAGNOSIS — D352 Benign neoplasm of pituitary gland: Secondary | ICD-10-CM

## 2023-09-10 NOTE — Progress Notes (Signed)
Outpatient Endocrinology Note Theresa Waldwick, MD    Theresa Saunders [birth year] [id number]  Referring Provider: No ref. provider found Primary Care Provider: Ara Kussmaul, MD (Inactive) Reason for consultation: Subjective   Assessment & Plan  Diagnoses and all orders for this visit:  Pituitary microadenoma with hyperprolactinemia (HCC) -     MR Brain W Wo Contrast; Future   Patient was diagnosed up to 3 mm pituitary adenoma versus Rathke's cyst 3 on 08/2022 MRI brain done after her prolactin was found mildly elevated at 30.8 in 05/2022 (was on birth control then, cut off 25). Three repeat prolactin levels have been WNL since (21.1, 24.5 and 18.2). Patient has had no galactorrhea. In peri-menopausal state with irregular menstrual cycle now. IGF was found mildly elevated at 232 on 10/24/2-23 (cut off <225), repeat was normal at 207 (last 150 in 05/2023). No head aches/vision issues/nausea/vomiting at this time.   Repeat MRI brain 1 year from last to follow up.  Return in about 3 months (around 12/11/2023).   I have reviewed current medications, nurse's notes, allergies, vital signs, past medical and surgical history, family medical history, and social history for this encounter. Counseled patient on symptoms, examination findings, lab findings, imaging results, treatment decisions and monitoring and prognosis. The patient understood the recommendations and agrees with the treatment plan. All questions regarding treatment plan were fully answered.  Theresa [place], MD  09/10/23   History of Present Illness HPI  Theresa Saunders is a 52 y.o. female referred by Dr. No ref. provider found for follow up of pituitary adenoma.   This was discovered by MRI in 08/2022. Most recent MRI demonstrated 3 mm pituitary adenoma vs Rathke's cleft cyst. Reviewed past records, seen by endocrinology at [facility].   Reports feeling better than has felt ina while No head aches except during  allergies No vision changes No breast discharge   Initial history:  She reports the following;  Headaches No visual blurring/ diplopia/ fields defect No galactorrhea No If female: Menarche was at age 5 and LMP 05/30/23, was on OCP till 10/2022, and has had irregular periods since   change in facial appearance Yes body habitus No change in her hand, ring Yes hat, shoe size No hyperhidrosis Yes arthralgias Yes  Family history is negative for pituitary tumor or other abnormalities concerning for MEN syndrome.    EXAM: Magnetic resonance imaging, brain without and with contrast material.  DATE: 08/29/2022 1:58 PM  INTERPRETATION LOCATION: [facility]   CLINICAL INDICATION: 52 years old Female with Hyperprolactinemia, headaches ; Headache, tension - type - E22.1 - Hyperprolactinemia (CMS - HCC)   COMPARISON: None   TECHNIQUE: Multiplanar, multisequence MR imaging of the brain was performed without and with I.V. contrast. Imaging of the pituitary gland including dynamic and thin section coronal and sagittal images was also performed.   FINDINGS:  Small left inferior pituitary nodule (13:4) measuring up to 3 mm which demonstrates hypoenhancement on the early dynamic contrast-enhanced studies, with subsequent wash-in of contrast. 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 The pituitary stalk is midline. The optic chiasm is normal.   Ventricles are normal in size. There is no midline shift. No extra-axial fluid collection. No evidence of intracranial hemorrhage. No diffusion weighted signal abnormality to suggest acute infarct. No cerebral or cerebellar mass. Moderate mucosal thickening of the bilateral maxillary  sinuses.   Component     Latest Ref Rng 06/05/2023  Sodium     135 - 145 mEq/L 139   Potassium     3.5 - 5.1 mEq/L 4.9   Chloride     96 - 112 mEq/L  105   CO2     19 - 32 mEq/L 26   Glucose     70 - 99 mg/dL 161 (H)   BUN     6 - 23 mg/dL 18   Creatinine     0.96 - 1.20 mg/dL 0.45   GFR     >40.98 mL/min 80.44   Calcium     8.4 - 10.5 mg/dL 9.7   Cortisol, Plasma     ug/dL 11.9   J478 ACTH     6 - 50 pg/mL 11   Prolactin     ng/mL 18.2   LH     mIU/mL 26.94   FSH     mIU/ML 60.2   Estradiol     pg/mL 19   T4,Free(Direct)     0.60 - 1.60 ng/dL 2.95   TSH     6.21 - 3.08 uIU/mL 4.41   Growth Hormone     < OR = 7.1 ng/mL 0.3              Physical Exam  BP 122/82   Pulse 91   Ht 5\' 6"  (1.676 m)   Wt 167 lb 12.8 oz (76.1 kg)   SpO2 97%   BMI 27.08 kg/m    Constitutional: well developed, well nourished Head: normocephalic, atraumatic Eyes: sclera anicteric, no redness Neck: supple Lungs: normal respiratory effort Neurology: alert and oriented Skin: dry, no appreciable rashes Musculoskeletal: no appreciable defects Psychiatric: normal mood and affect   Current Medications Patient's Medications  New Prescriptions   No medications on file  Previous Medications   NORETHINDRONE (AYGESTIN) 5 MG TABLET    Take 2 tablets by mouth daily.  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  Social History Social History   Socioeconomic History   Marital status: Single    Spouse name: Not on file   Number of children: 3   Years of education: Not on file   Highest education level: Not on file  Occupational History    Employer: [employer]    Comment: Curator  Tobacco Use   Smoking  status: Former    Current packs/day: 0.50    Types: Cigarettes   Smokeless tobacco: Not on file   Tobacco comments:    quit about 5 months  Substance and Sexual Activity   Alcohol use: No   Drug use: No   Sexual activity: Yes    Partners: Female    Birth control/protection: Surgical    Comment: husband-vasectomy  Other Topics Concern   Not on file  Social History Narrative   Not on file   Social Determinants of Health   Financial Resource Strain: Not on file  Food Insecurity: Not on file  Transportation Needs:  Not on file  Physical Activity: Not on file  Stress: Not on file  Social Connections: Not on file  Intimate Partner Violence: Not At Risk (05/14/2022)   Received from [facility], [facility]   Humiliation, Afraid, Rape, and Kick questionnaire    Fear of Current or Ex-Partner: No    Emotionally Abused: No    Physically Abused: No    Sexually Abused: No    No results found for: "CHOL" No results found for: "HDL" No results found for: "LDLCALC" No results found for: "TRIG" No results found for: "CHOLHDL" Lab Results  Component Value Date   CREATININE 0.84 06/05/2023   Lab Results  Component Value Date   GFR 80.44 06/05/2023      Component Value Date/Time   NA 139 06/05/2023 0847   K 4.9 06/05/2023 0847   CL 105 06/05/2023 0847   CO2 26 06/05/2023 0847   GLUCOSE 103 (H) 06/05/2023 0847   BUN 18 06/05/2023 0847   CREATININE 0.84 06/05/2023 0847   CALCIUM 9.7 06/05/2023 0847      Latest Ref Rng & Units 06/05/2023    8:47 AM  BMP  Glucose 70 - 99 mg/dL 130   BUN 6 - 23 mg/dL 18   Creatinine 8.65 - 1.20 mg/dL 7.84   Sodium 696 - 295 mEq/L 139   Potassium 3.5 - 5.1 mEq/L 4.9   Chloride 96 - 112 mEq/L 105   CO2 19 - 32 mEq/L 26   Calcium 8.4 - 10.5 mg/dL 9.7     No results found for: "WBC", "RBC", "HGB", "HCT", "PLT", "MCV", "MCH", "MCHC", "RDW", "LYMPHSABS", "MONOABS", "EOSABS", "BASOSABS" Lab Results  Component Value Date   TSH 4.41  06/05/2023   FREET4 0.82 06/05/2023         Parts of this note may have been dictated using voice recognition software. There may be variances in spelling and vocabulary which are unintentional. Not all errors are proofread. Please notify the Thereasa Parkin if any discrepancies are noted or if the meaning of any statement is not clear.

## 2023-09-17 DIAGNOSIS — Z1231 Encounter for screening mammogram for malignant neoplasm of breast: Secondary | ICD-10-CM | POA: Diagnosis not present

## 2023-09-17 DIAGNOSIS — Z01419 Encounter for gynecological examination (general) (routine) without abnormal findings: Secondary | ICD-10-CM | POA: Diagnosis not present

## 2023-10-24 DIAGNOSIS — Z1231 Encounter for screening mammogram for malignant neoplasm of breast: Secondary | ICD-10-CM | POA: Diagnosis not present

## 2023-11-25 ENCOUNTER — Telehealth: Payer: Self-pay

## 2023-11-25 NOTE — Telephone Encounter (Signed)
 Patient wants to make sure that her MRI doesn't need re approval since it was approved in November.

## 2023-12-02 ENCOUNTER — Ambulatory Visit (HOSPITAL_COMMUNITY)
Admission: RE | Admit: 2023-12-02 | Discharge: 2023-12-02 | Disposition: A | Discharge status: Home / Self Care | Payer: BC Managed Care – PPO | Source: Ambulatory Visit | Attending: "Endocrinology | Admitting: "Endocrinology

## 2023-12-02 DIAGNOSIS — E229 Hyperfunction of pituitary gland, unspecified: Secondary | ICD-10-CM | POA: Diagnosis not present

## 2023-12-02 DIAGNOSIS — E236 Other disorders of pituitary gland: Secondary | ICD-10-CM | POA: Diagnosis not present

## 2023-12-02 DIAGNOSIS — D352 Benign neoplasm of pituitary gland: Secondary | ICD-10-CM | POA: Diagnosis not present

## 2023-12-02 MED ORDER — GADOBUTROL 1 MMOL/ML IV SOLN
7.0000 mL | Freq: Once | INTRAVENOUS | Status: AC | PRN
  Administered 2023-12-02: 7 mL via INTRAVENOUS

## 2023-12-12 ENCOUNTER — Ambulatory Visit: Payer: BC Managed Care – PPO | Admitting: "Endocrinology

## 2023-12-12 ENCOUNTER — Encounter: Payer: Self-pay | Admitting: "Endocrinology

## 2023-12-12 VITALS — BP 126/82 | HR 79 | Ht 66.0 in | Wt 172.6 lb

## 2023-12-12 DIAGNOSIS — E236 Other disorders of pituitary gland: Secondary | ICD-10-CM | POA: Diagnosis not present

## 2023-12-12 NOTE — Progress Notes (Signed)
Outpatient Endocrinology Note Altamese Lewisburg, MD    KARALEIGH HELLARD 1971-07-11 [id number]  Referring Provider: No ref. provider found Primary Care Provider: Ara Kussmaul, MD (Inactive) Reason for consultation: Subjective   Assessment & Plan  Theresa Saunders was seen today for follow-up.  Diagnoses and all orders for this visit:  Pituitary cyst [facility])   Patient was diagnosed up to 3 mm pituitary adenoma versus Rathke's cyst 3 on 08/2022 MRI brain done after her prolactin was found mildly elevated at 30.8 in 05/2022 (was on birth control then, cut off 25). Three repeat prolactin levels have been WNL since (21.1, 24.5 and 18.2). Patient has had no galactorrhea. In peri-menopausal state with irregular menstrual cycle now. IGF was found mildly elevated at 232 on 10/24/2-23 (cut off <225), repeat was normal at 207 (last 150 in 05/2023). No head aches/vision issues/nausea/vomiting at this time.  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 No sign of adenoma. Continue monitoring clinically   Return in about 1 year (around 12/11/2024).   I have reviewed current medications, nurse's notes, allergies, vital signs, past medical and surgical history, family medical history, and social history for this encounter. Counseled patient on symptoms, examination findings, lab findings, imaging results, treatment decisions and monitoring and prognosis. The patient understood the recommendations and agrees with the treatment plan. All questions regarding treatment plan were fully answered.  Altamese Veneta, MD  12/12/23   History of Present Illness HPI  Theresa Saunders is a 53 y.o. female referred by Dr. No ref. provider found for follow up of pituitary adenoma.   This was discovered by MRI in 08/2022. Most recent MRI demonstrated 3 mm pituitary adenoma vs Rathke's cleft cyst. Reviewed past records, seen by endocrinology at [facility]    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 No sign of adenoma.  Initial history:  She reports the following;  Headaches No visual blurring/ diplopia/ fields defect No galactorrhea No If female: Menarche was at age 48 and LMP 05/30/23, was on OCP till 10/2022, and has had irregular periods since   change in facial appearance Yes body habitus No change in her hand, ring Yes hat, shoe size No hyperhidrosis Yes arthralgias Yes  Family history is negative for pituitary tumor or other abnormalities concerning for MEN syndrome.    EXAM: Magnetic resonance imaging, brain without and with contrast material.  DATE: 08/29/2022 1:58 PM  INTERPRETATION LOCATION: [facility]   CLINICAL INDICATION: 53 years old Female with Hyperprolactinemia, headaches ; Headache, tension - type - E22.1 - Hyperprolactinemia (CMS - HCC)   COMPARISON: None   TECHNIQUE: Multiplanar, multisequence MR imaging of the brain was performed without and with I.V. contrast. Imaging of the pituitary gland including dynamic and thin section coronal and sagittal images was also performed.   FINDINGS:  Small left inferior pituitary nodule (13:4) measuring up to 3 mm which demonstrates hypoenhancement on the early dynamic contrast-enhanced studies, with subsequent wash-in of contrast. 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 The pituitary stalk is midline. The optic chiasm is normal.  Ventricles are normal in size. There is no midline shift. No extra-axial fluid collection. No evidence of intracranial hemorrhage. No diffusion weighted signal abnormality to suggest  acute infarct. No cerebral or cerebellar mass. Moderate mucosal thickening of the bilateral maxillary sinuses.   Component     Latest Ref Rng 06/05/2023  Sodium     135 - 145 mEq/L 139   Potassium     3.5 - 5.1 mEq/L 4.9   Chloride     96 - 112 mEq/L 105   CO2     19 - 32 mEq/L 26   Glucose     70 - 99 mg/dL 270 (H)   BUN     6 - 23 mg/dL 18   Creatinine     3.50 - 1.20 mg/dL 0.93   GFR     >81.82 mL/min 80.44   Calcium     8.4 - 10.5 mg/dL 9.7   Cortisol, Plasma     ug/dL 99.3   Z169 ACTH     6 - 50 pg/mL 11   Prolactin     ng/mL 18.2   LH     mIU/mL 26.94   FSH     mIU/ML 60.2   Estradiol     pg/mL 19   T4,Free(Direct)     0.60 - 1.60 ng/dL 6.78   TSH     9.38 - 1.01 uIU/mL 4.41   Growth Hormone     < OR = 7.1 ng/mL 0.3              Physical Exam  BP 126/82 (BP Location: Left Arm, Patient Position: Sitting, Cuff Size: Normal)   Pulse 79   Ht 5\' 6"  (1.676 m)   Wt 172 lb 9.6 oz (78.3 kg)   SpO2 96%   BMI 27.86 kg/m    Constitutional: well developed, well nourished Head: normocephalic, atraumatic Eyes: sclera anicteric, no redness Neck: supple Lungs: normal respiratory effort Neurology: alert and oriented Skin: dry, no appreciable rashes Musculoskeletal: no appreciable defects Psychiatric: normal mood and affect   Current Medications Patient's Medications  New Prescriptions   No medications on file  Previous Medications   NORETHINDRONE (AYGESTIN) 5 MG TABLET    Take 2 tablets by mouth daily.  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  Social History Social History   Socioeconomic History   Marital status: Single    Spouse name: Not on file   Number of children: 3   Years of education: Not on file   Highest education level: Not on file  Occupational History    Employer: [employer]    Comment: Curator  Tobacco Use   Smoking status: Former    Current packs/day: 0.50    Types: Cigarettes   Smokeless tobacco: Not on file   Tobacco comments:    quit about 5 months  Substance and Sexual Activity   Alcohol use: No   Drug use: No   Sexual activity: Yes    Partners: Female  Birth control/protection: Surgical    Comment: husband-vasectomy  Other Topics Concern   Not on file  Social History Narrative   Not on file   Social Drivers of Health   Financial Resource Strain: Not on file  Food Insecurity: Not on file  Transportation Needs: Not on file  Physical Activity: Not on file  Stress: Not on file  Social Connections: Not on file  Intimate Partner Violence: Not At Risk (09/17/2023)   Received from [facility]   Humiliation, Afraid, Rape, and Kick questionnaire    Fear of Current or Ex-Partner: No    Emotionally Abused: No    Physically Abused: No    Sexually Abused: No    No results found for: "CHOL" No results found for: "HDL" No results found for: "LDLCALC" No results found for: "TRIG" No results found for: "CHOLHDL" Lab Results  Component Value Date   CREATININE 0.84 06/05/2023   Lab Results  Component Value Date   GFR 80.44 06/05/2023      Component Value Date/Time   NA 139 06/05/2023 0847   K 4.9 06/05/2023 0847   CL 105 06/05/2023 0847   CO2 26 06/05/2023 0847   GLUCOSE 103 (H) 06/05/2023 0847   BUN 18 06/05/2023 0847   CREATININE 0.84 06/05/2023 0847   CALCIUM 9.7 06/05/2023 0847      Latest Ref Rng & Units 06/05/2023    8:47 AM  BMP  Glucose 70 - 99 mg/dL 191   BUN 6 - 23 mg/dL 18   Creatinine 4.78 - 1.20 mg/dL 2.95   Sodium 621 - 308 mEq/L 139    Potassium 3.5 - 5.1 mEq/L 4.9   Chloride 96 - 112 mEq/L 105   CO2 19 - 32 mEq/L 26   Calcium 8.4 - 10.5 mg/dL 9.7     No results found for: "WBC", "RBC", "HGB", "HCT", "PLT", "MCV", "MCH", "MCHC", "RDW", "LYMPHSABS", "MONOABS", "EOSABS", "BASOSABS" Lab Results  Component Value Date   TSH 4.41 06/05/2023   FREET4 0.82 06/05/2023         Parts of this note may have been dictated using voice recognition software. There may be variances in spelling and vocabulary which are unintentional. Not all errors are proofread. Please notify the Thereasa Parkin if any discrepancies are noted or if the meaning of any statement is not clear.

## 2024-12-14 ENCOUNTER — Ambulatory Visit: Payer: BC Managed Care – PPO | Admitting: "Endocrinology
